# Patient Record
Sex: Female | Born: 1995 | Race: Black or African American | Hispanic: No | Marital: Single | Smoking: Never smoker
Health system: Southern US, Community
[De-identification: ages and names within clinical notes are randomized; demographics above are authoritative.]

## PROBLEM LIST (undated history)

## (undated) DIAGNOSIS — N750 Cyst of Bartholin's gland: Secondary | ICD-10-CM

## (undated) HISTORY — PX: BREAST BIOPSY: SHX20

---

## 2013-05-23 ENCOUNTER — Other Ambulatory Visit (HOSPITAL_COMMUNITY)
Admission: RE | Admit: 2013-05-23 | Discharge: 2013-05-23 | Disposition: A | Discharge status: Home / Self Care | Payer: BC Managed Care – PPO | Source: Ambulatory Visit | Attending: Emergency Medicine | Admitting: Emergency Medicine

## 2013-05-23 ENCOUNTER — Encounter (HOSPITAL_COMMUNITY): Payer: Self-pay | Admitting: Emergency Medicine

## 2013-05-23 ENCOUNTER — Emergency Department (INDEPENDENT_AMBULATORY_CARE_PROVIDER_SITE_OTHER)
Admission: EM | Admit: 2013-05-23 | Discharge: 2013-05-23 | Disposition: A | Discharge status: Home / Self Care | Payer: BC Managed Care – PPO | Source: Home / Self Care

## 2013-05-23 DIAGNOSIS — N760 Acute vaginitis: Secondary | ICD-10-CM

## 2013-05-23 DIAGNOSIS — N730 Acute parametritis and pelvic cellulitis: Secondary | ICD-10-CM

## 2013-05-23 DIAGNOSIS — N72 Inflammatory disease of cervix uteri: Secondary | ICD-10-CM

## 2013-05-23 DIAGNOSIS — Z113 Encounter for screening for infections with a predominantly sexual mode of transmission: Secondary | ICD-10-CM | POA: Insufficient documentation

## 2013-05-23 DIAGNOSIS — N898 Other specified noninflammatory disorders of vagina: Secondary | ICD-10-CM

## 2013-05-23 LAB — POCT URINALYSIS DIP (DEVICE)
BILIRUBIN URINE: NEGATIVE
Glucose, UA: NEGATIVE mg/dL
KETONES UR: NEGATIVE mg/dL
Nitrite: NEGATIVE
Protein, ur: NEGATIVE mg/dL
Specific Gravity, Urine: 1.025 (ref 1.005–1.030)
Urobilinogen, UA: 1 mg/dL (ref 0.0–1.0)
pH: 6 (ref 5.0–8.0)

## 2013-05-23 LAB — POCT PREGNANCY, URINE: Preg Test, Ur: NEGATIVE

## 2013-05-23 MED ORDER — AZITHROMYCIN 250 MG PO TABS: ORAL_TABLET | ORAL | Status: DC

## 2013-05-23 MED ORDER — LIDOCAINE HCL (PF) 1 % IJ SOLN
INTRAMUSCULAR | Status: AC
  Filled 2013-05-23: qty 5

## 2013-05-23 MED ORDER — CEFTRIAXONE SODIUM 250 MG IJ SOLR
INTRAMUSCULAR | Status: AC
  Filled 2013-05-23: qty 250

## 2013-05-23 MED ORDER — METRONIDAZOLE 500 MG PO TABS: 500.0000 mg | ORAL_TABLET | Freq: Two times a day (BID) | ORAL | Status: DC

## 2013-05-23 MED ORDER — CEFTRIAXONE SODIUM 250 MG IJ SOLR
250.0000 mg | Freq: Once | INTRAMUSCULAR | Status: AC
  Administered 2013-05-23: 250 mg via INTRAMUSCULAR

## 2013-05-23 NOTE — ED Notes (Signed)
Pt received injection will discharge at 3:30.  Mw,cma

## 2013-05-23 NOTE — Discharge Instructions (Signed)
Bacterial Vaginosis Bacterial vaginosis is a vaginal infection that occurs when the normal balance of bacteria in the vagina is disrupted. It results from an overgrowth of certain bacteria. This is the most common vaginal infection in women of childbearing age. Treatment is important to prevent complications, especially in pregnant women, as it can cause a premature delivery. CAUSES  Bacterial vaginosis is caused by an increase in harmful bacteria that are normally present in smaller amounts in the vagina. Several different kinds of bacteria can cause bacterial vaginosis. However, the reason that the condition develops is not fully understood. RISK FACTORS Certain activities or behaviors can put you at an increased risk of developing bacterial vaginosis, including:  Having a new sex partner or multiple sex partners.  Douching.  Using an intrauterine device (IUD) for contraception. Women do not get bacterial vaginosis from toilet seats, bedding, swimming pools, or contact with objects around them. SIGNS AND SYMPTOMS  Some women with bacterial vaginosis have no signs or symptoms. Common symptoms include:  Grey vaginal discharge.  A fishlike odor with discharge, especially after sexual intercourse.  Itching or burning of the vagina and vulva.  Burning or pain with urination. DIAGNOSIS  Your health care provider will take a medical history and examine the vagina for signs of bacterial vaginosis. A sample of vaginal fluid may be taken. Your health care provider will look at this sample under a microscope to check for bacteria and abnormal cells. A vaginal pH test may also be done.  TREATMENT  Bacterial vaginosis may be treated with antibiotic medicines. These may be given in the form of a pill or a vaginal cream. A second round of antibiotics may be prescribed if the condition comes back after treatment.  HOME CARE INSTRUCTIONS   Only take over-the-counter or prescription medicines as  directed by your health care provider.  If antibiotic medicine was prescribed, take it as directed. Make sure you finish it even if you start to feel better.  Do not have sex until treatment is completed.  Tell all sexual partners that you have a vaginal infection. They should see their health care provider and be treated if they have problems, such as a mild rash or itching.  Practice safe sex by using condoms and only having one sex partner. SEEK MEDICAL CARE IF:   Your symptoms are not improving after 3 days of treatment.  You have increased discharge or pain.  You have a fever. MAKE SURE YOU:   Understand these instructions.  Will watch your condition.  Will get help right away if you are not doing well or get worse. FOR MORE INFORMATION  Centers for Disease Control and Prevention, Division of STD Prevention: SolutionApps.co.za American Sexual Health Association (ASHA): www.ashastd.org  Document Released: 03/16/2005 Document Revised: 01/04/2013 Document Reviewed: 10/26/2012 [facility] Patient Information 2014 [place], [place].  Cervicitis Cervicitis is a soreness and puffiness (inflammation) of the cervix.  HOME CARE  Do not have sex (intercourse) until your doctor says it is okay.  Do not have sex until your partner is treated or as told by your doctor.  Take your antibiotic medicine as told. Finish it even if you start to feel better. GET HELP IF:   Yours symptoms that brought you to the doctor come back.  You have a fever. MAKE SURE YOU:   Understand these instructions.  Will watch your condition.  Will get help right away if you are not doing well or get worse. Document Released: 12/24/2007 Document Revised: 11/16/2012  Document Reviewed: 09/07/2012 [facility] Patient Information 2014 [place], [place].  Pelvic Inflammatory Disease Pelvic inflammatory disease (PID) refers to an infection in some or all of the female organs. The infection can be in the uterus,  ovaries, fallopian tubes, or the surrounding tissues in the pelvis. PID can cause abdominal or pelvic pain that comes on suddenly (acute pelvic pain). PID is a serious infection because it can lead to lasting (chronic) pelvic pain or the inability to have children (infertile).  CAUSES  The infection is often caused by the normal bacteria found in the vaginal tissues. PID may also be caused by an infection that is spread during sexual contact. PID can also occur following:   The birth of a baby.   A miscarriage.   An abortion.   Major pelvic surgery.   The use of an intrauterine device (IUD).   A sexual assault.  RISK FACTORS Certain factors can put a person at higher risk for PID, such as:  Being younger than 25 years.  Being sexually active at [place] age.  Usingnonbarrier contraception.  Havingmultiple sexual partners.  Having sex with someone who has symptoms of a genital infection.  Using oral contraception. Other times, certain behaviors can increase the possibility of getting PID, such as:  Having sex during your period.  Using a vaginal douche.  Having an intrauterine device (IUD) in place. SYMPTOMS   Abdominal or pelvic pain.   Fever.   Chills.   Abnormal vaginal discharge.  Abnormal uterine bleeding.   Unusual pain shortly after finishing your period. DIAGNOSIS  Your caregiver will choose some of the following methods to make a diagnosis, such as:   Performinga physical exam and history. A pelvic exam typically reveals a very tender uterus and surrounding pelvis.   Ordering laboratory tests including a pregnancy test, blood tests, and urine test.  Orderingcultures of the vagina and cervix to check for a sexually transmitted infection (STI).  Performing an ultrasound.   Performing a laparoscopic procedure to look inside the pelvis.  TREATMENT   Antibiotic medicines may be prescribed and taken by mouth.   Sexual partners may  be treated when the infection is caused by a sexually transmitted disease (STD).   Hospitalization may be needed to give antibiotics intravenously.  Surgery may be needed, but this is rare. It may take weeks until you are completely well. If you are diagnosed with PID, you should also be checked for human immunodeficiency virus (HIV). HOME CARE INSTRUCTIONS   If given, take your antibiotics as directed. Finish the medicine even if you start to feel better.   Only take over-the-counter or prescription medicines for pain, discomfort, or fever as directed by your caregiver.   Do not have sexual intercourse until treatment is completed or as directed by your caregiver. If PID is confirmed, your recent sexual partner(s) will need treatment.   Keep your follow-up appointments. SEEK MEDICAL CARE IF:   You have increased or abnormal vaginal discharge.   You need prescription medicine for your pain.   You vomit.   You cannot take your medicines.   Your partner has an STD.  SEEK IMMEDIATE MEDICAL CARE IF:   You have a fever.   You have increased abdominal or pelvic pain.   You have chills.   You have pain when you urinate.   You are not better after 72 hours following treatment.  MAKE SURE YOU:   Understand these instructions.  Will watch your condition.  Will get help  right away if you are not doing well or get worse. Document Released: 03/16/2005 Document Revised: 07/11/2012 Document Reviewed: 03/12/2011 [facility] Patient Information 2014 [place].  Sexually Transmitted Disease A sexually transmitted disease (STD) is a disease or infection that may be passed (transmitted) from person to person, usually during sexual activity. This may happen by way of saliva, semen, blood, vaginal mucus, or urine. Common STDs include:   Gonorrhea.   Chlamydia.   Syphilis.   HIV and AIDS.   Genital herpes.   Hepatitis B and C.   Trichomonas.   Human  papillomavirus (HPV).   Pubic lice.   Scabies.  Mites.  Bacterial vaginosis. WHAT ARE CAUSES OF STDs? An STD may be caused by bacteria, a virus, or parasites. STDs are often transmitted during sexual activity if one person is infected. However, they may also be transmitted through nonsexual means. STDs may be transmitted after:   Sexual intercourse with an infected person.   Sharing sex toys with an infected person.   Sharing needles with an infected person or using unclean piercing or tattoo needles.  Having intimate contact with the genitals, mouth, or rectal areas of an infected person.   Exposure to infected fluids during birth. WHAT ARE THE SIGNS AND SYMPTOMS OF STDs? Different STDs have different symptoms. Some people may not have any symptoms. If symptoms are present, they may include:   Painful or bloody urination.   Pain in the pelvis, abdomen, vagina, anus, throat, or eyes.   Skin rash, itching, irritation, growths, sores (lesions), ulcerations, or warts in the genital or anal area.  Abnormal vaginal discharge with or without bad odor.   Penile discharge in men.   Fever.   Pain or bleeding during sexual intercourse.   Swollen glands in the groin area.   Yellow skin and eyes (jaundice). This is seen with hepatitis.   Swollen testicles.  Infertility.  Sores and blisters in the mouth. HOW ARE STDs DIAGNOSED? To make a diagnosis, your health care provider may:   Take a medical history.   Perform a physical exam.   Take a sample of any discharge for examination.  Swab the throat, cervix, opening to the penis, rectum, or vagina for testing.  Test a sample of your first morning urine.   Perform blood tests.   Perform a Pap smear, if this applies.   Perform a colposcopy.   Perform a laparoscopy.  HOW ARE STDs TREATED? Treatment depends on the STD. Some STDs may be treated but not cured.   Chlamydia, gonorrhea, trichomonas,  and syphilis can be cured with antibiotics.   Genital herpes, hepatitis, and HIV can be treated, but not cured, with prescribed medicines. The medicines lessen symptoms.   Genital warts from HPV can be treated with medicine or by freezing, burning (electrocautery), or surgery. Warts may come back.   HPV cannot be cured with medicine or surgery. However, abnormal areas may be removed from the cervix, vagina, or vulva.   If your diagnosis is confirmed, your recent sexual partners need treatment. This is true even if they are symptom-free or have a negative culture or evaluation. They should not have sex until their health care providers say it is OK. HOW CAN I REDUCE MY RISK OF GETTING AN STD?  Use latex condoms, dental dams, and water-soluble lubricants during sexual activity. Do not use petroleum jelly or oils.  Get vaccinated for HPV and hepatitis. If you have not received these vaccines in the past, talk to  your health care provider about whether one or both might be right for you.   Avoid risky sex practices that can break the skin.  WHAT SHOULD I DO IF I THINK I HAVE AN STD?  See your health care provider.   Inform all sexual partners. They should be tested and treated for any STDs.  Do not have sex until your health care provider says it is OK. WHEN SHOULD I GET HELP? Seek immediate medical care if:  You develop severe abdominal pain.  You are a man and notice swelling or pain in the testicles.  You are a woman and notice swelling or pain in your vagina. Document Released: 06/06/2002 Document Revised: 01/04/2013 Document Reviewed: 10/04/2012 [facility] Patient Information 2014 [place], [place].  Vaginitis Vaginitis is an inflammation of the vagina. It can happen when the normal bacteria and yeast in the vagina grow too much. There are different types. Treatment will depend on the type you have. HOME CARE  Take all medicines as told by your doctor.  Keep your vagina  area clean and dry. Avoid soap. Rinse the area with water.  Avoid washing and cleaning out the vagina (douching).  Do not use tampons or have sex (intercourse) until your treatment is done.  Wipe from front to back after going to the restroom.  Wear cotton underwear.  Avoid wearing underwear while you sleep until your vaginitis is gone.  Avoid tight pants. Avoid underwear or nylons without a cotton panel.  Take off wet clothing (such as a bathing suit) as soon as you can.  Use mild, unscented products. Avoid fabric softeners and scented:  Feminine sprays.  Laundry detergents.  Tampons.  Soaps or bubble baths.  Practice safe sex and use condoms. GET HELP RIGHT AWAY IF:   You have belly (abdominal) pain.  You have a fever or lasting symptoms for more than 2 3 days.  You have a fever and your symptoms suddenly get worse. MAKE SURE YOU:   Understand these instructions.  Will watch this condition.  Will get help right away if you are not doing well or get worse. Document Released: 06/12/2008 Document Revised: 12/09/2011 Document Reviewed: 08/27/2011 [facility] Patient Information 2014 [place], [place].

## 2013-05-23 NOTE — ED Notes (Signed)
C/o vaginal pain with discharge. Light pink discharge.   Symptoms present x 3 days.  Lower abdominal and pelvic pain.  Denies fever, n/v.    Denies concerns for std's

## 2013-05-23 NOTE — ED Provider Notes (Addendum)
CSN: [id number]     Arrival date & time 05/23/13  1343 History   First MD Initiated Contact with Patient 05/23/13 1444     Chief Complaint  Patient presents with  . Vaginal Pain     (Consider location/radiation/quality/duration/timing/severity/associated sxs/prior Treatment) HPI Comments: 18 year old female complaining of vaginal pain, swelling and a pink discharge 2-3 days. Denies fever or GI symptoms. Currently denies abdominal or pelvic pain.  Patient is a 18 y.o. female presenting with vaginal pain.  Vaginal Pain    History reviewed. No pertinent past medical history. History reviewed. No pertinent past surgical history. History reviewed. No pertinent family history. History  Substance Use Topics  . Smoking status: Never Smoker   . Smokeless tobacco: Not on file  . Alcohol Use: No   OB History   Grav Para Term Preterm Abortions TAB SAB Ect Mult Living                 Review of Systems  Constitutional: Negative.   Respiratory: Negative.   Gastrointestinal: Negative.   Genitourinary: Positive for vaginal pain. Negative for dysuria, frequency and menstrual problem.       As per history of present illness  Neurological: Negative.       Allergies  Review of patient's allergies indicates no known allergies.  Home Medications   Current Outpatient Rx  Name  Route  Sig  Dispense  Refill  . azithromycin (ZITHROMAX) 250 MG tablet      Take all 4 tabs po stat   4 tablet   0   . metroNIDAZOLE (FLAGYL) 500 MG tablet   Oral   Take 1 tablet (500 mg total) by mouth 2 (two) times daily. X 7 days   14 tablet   0    BP 111/75  Pulse 66  Temp(Src) 98.2 F (36.8 C) (Oral)  Resp 16  SpO2 100%  LMP 04/30/2013 Physical Exam  Nursing note and vitals reviewed. Constitutional: She is oriented to person, place, and time. She appears well-developed and well-nourished. No distress.  Neck: Normal range of motion. Neck supple.  Cardiovascular: Normal rate.    Pulmonary/Chest: Effort normal. No respiratory distress.  Abdominal: Soft. Bowel sounds are normal. She exhibits no distension and no mass. There is no tenderness. There is no rebound and no guarding.  Genitourinary:  Normal external female genitalia. Trace amount of dried white discharge on the vulva. Cervix is left of midline posterior there are several erythematous papular vesicular type lesions on the ectocervix and external cervix. Os is nulli parous. There is a thick white discharge in the proximal Armeniahina in vaginal vault as well as coating the cervix. No bleeding is seen.  Musculoskeletal: She exhibits no edema and no tenderness.  Neurological: She is alert and oriented to person, place, and time. She exhibits normal muscle tone.  Skin: Skin is warm and dry.  Psychiatric: She has a normal mood and affect.    ED Course  Procedures (including critical care time) Labs Review Labs Reviewed  POCT URINALYSIS DIP (DEVICE) - Abnormal; Notable for the following:    Hgb urine dipstick MODERATE (*)    Leukocytes, UA LARGE (*)    All other components within normal limits  POCT PREGNANCY, URINE  CERVICOVAGINAL ANCILLARY ONLY   Imaging Review No results found.    MDM   Final diagnoses:  PID (acute pelvic inflammatory disease)  Cervicitis  Vaginal discharge  Acute vulvovaginitis   Rocephin 250 mg IM now Rx for azithromycin 1 g  by mouth now and Rx for Flagyl 500 twice a day for 7 days Ancillary cytology results pending. No urinary symptoms, suspect urine is contaminated.     Hayden Rasmussen, NP 05/23/13 1509  Diflucan 150 mg x 2 doses Rx'd post cytology. 05/25/13  Hayden Rasmussen, NP 05/25/13 2035

## 2013-05-24 LAB — CERVICOVAGINAL ANCILLARY ONLY
Chlamydia: NEGATIVE
NEISSERIA GONORRHEA: NEGATIVE
WET PREP (BD AFFIRM): POSITIVE — AB
Wet Prep (BD Affirm): NEGATIVE
Wet Prep (BD Affirm): POSITIVE — AB

## 2013-05-24 NOTE — ED Provider Notes (Signed)
Medical screening examination/treatment/procedure(s) were performed by a resident physician or non-physician practitioner and as the supervising physician I was immediately available for consultation/collaboration.  Alliah Boulanger, MD    Emerald Shor S Jaide Hillenburg, MD 05/24/13 0742 

## 2013-05-25 ENCOUNTER — Telehealth (HOSPITAL_COMMUNITY): Payer: Self-pay | Admitting: *Deleted

## 2013-05-25 MED ORDER — FLUCONAZOLE 150 MG PO TABS: ORAL_TABLET | ORAL | Status: DC

## 2013-05-25 NOTE — ED Notes (Addendum)
GC/Chlamydia neg., Affirm: Candida and Gardnerella pos., Trich neg. Pt. adequately treated with Flagyl for bacterial vaginosis.   2/25 Message sent to Hayden Rasmussenavid Mabe NP. Vassie MoselleYork, Nikkita Adeyemi M 05/25/2013

## 2013-05-25 NOTE — ED Notes (Addendum)
Julie Rasmussenavid Mabe NP gave verbal order for Diflucan 150 mg. # 2- take one now and repeat if needed.  I called pt., but her VM is not set up. I called her contact (grandmother) and left a message for her to call.  Call 1. Julie Cobb, Julie Cobb 05/25/2013 Pt. called back.  Pt. verified x 2 and given results.  Pt. told she was adequately treated for bacterial vaginosis with Flagyl and needs Diflucan for Candida. Pt. wants Rx. [facility] [address] Rx. called to pharmacist @ [phone]. 05/25/2013

## 2013-05-26 NOTE — ED Provider Notes (Signed)
Medical screening examination/treatment/procedure(s) were performed by a resident physician or non-physician practitioner and as the supervising physician I was immediately available for consultation/collaboration.  Evan Corey, MD    Evan S Corey, MD 05/26/13 0733 

## 2013-10-10 ENCOUNTER — Encounter (HOSPITAL_COMMUNITY): Payer: Self-pay | Admitting: Emergency Medicine

## 2013-10-10 ENCOUNTER — Emergency Department (INDEPENDENT_AMBULATORY_CARE_PROVIDER_SITE_OTHER)
Admission: EM | Admit: 2013-10-10 | Discharge: 2013-10-10 | Disposition: A | Discharge status: Home / Self Care | Payer: BC Managed Care – PPO | Source: Home / Self Care

## 2013-10-10 ENCOUNTER — Other Ambulatory Visit (HOSPITAL_COMMUNITY)
Admission: RE | Admit: 2013-10-10 | Discharge: 2013-10-10 | Disposition: A | Discharge status: Home / Self Care | Payer: BC Managed Care – PPO | Source: Ambulatory Visit | Attending: Family Medicine | Admitting: Family Medicine

## 2013-10-10 DIAGNOSIS — N760 Acute vaginitis: Secondary | ICD-10-CM | POA: Insufficient documentation

## 2013-10-10 DIAGNOSIS — Z113 Encounter for screening for infections with a predominantly sexual mode of transmission: Secondary | ICD-10-CM | POA: Insufficient documentation

## 2013-10-10 DIAGNOSIS — N898 Other specified noninflammatory disorders of vagina: Secondary | ICD-10-CM

## 2013-10-10 LAB — POCT URINALYSIS DIP (DEVICE)
Bilirubin Urine: NEGATIVE
Glucose, UA: NEGATIVE mg/dL
Hgb urine dipstick: NEGATIVE
Ketones, ur: NEGATIVE mg/dL
Leukocytes, UA: NEGATIVE
NITRITE: NEGATIVE
Protein, ur: NEGATIVE mg/dL
SPECIFIC GRAVITY, URINE: 1.02 (ref 1.005–1.030)
UROBILINOGEN UA: 1 mg/dL (ref 0.0–1.0)
pH: 7 (ref 5.0–8.0)

## 2013-10-10 LAB — POCT PREGNANCY, URINE: Preg Test, Ur: NEGATIVE

## 2013-10-10 NOTE — ED Provider Notes (Signed)
CSN: [id number]     Arrival date & time 10/10/13  1706 History   None    Chief Complaint  Patient presents with  . Vaginal Discharge   (Consider location/radiation/quality/duration/timing/severity/associated sxs/prior Treatment) HPI  Vaginal discharge. Started 7 days ago. No change. Denies fevers, odor, irritation, abd pain, dysuria, frequency. Has had this occur before. Sexually active and does not protect. No new sexual partners. Cranberry juice w/o benefit. H/o PID several months ago.   History reviewed. No pertinent past medical history. History reviewed. No pertinent past surgical history. No family history on file. History  Substance Use Topics  . Smoking status: Never Smoker   . Smokeless tobacco: Not on file  . Alcohol Use: No   OB History   Grav Para Term Preterm Abortions TAB SAB Ect Mult Living                 Review of Systems  Per hpi w/ all other systems negative.  Allergies  Review of patient's allergies indicates no known allergies.  Home Medications   Prior to Admission medications   Medication Sig Start Date End Date Taking? Authorizing Provider  azithromycin (ZITHROMAX) 250 MG tablet Take all 4 tabs po stat 05/23/13   Hayden Rasmussenavid Mabe, NP  fluconazole (DIFLUCAN) 150 MG tablet 1 tab po x 1. May repeat in 72 hours if no improvement 05/25/13   Hayden Rasmussenavid Mabe, NP  metroNIDAZOLE (FLAGYL) 500 MG tablet Take 1 tablet (500 mg total) by mouth 2 (two) times daily. X 7 days 05/23/13   Hayden Rasmussenavid Mabe, NP   BP 132/76  Pulse 80  Temp(Src) 98.5 F (36.9 C) (Oral)  Resp 16  SpO2 100% Physical Exam  Constitutional: She appears well-developed and well-nourished. No distress.  HENT:  Head: Normocephalic and atraumatic.  Eyes: EOM are normal. Pupils are equal, round, and reactive to light.  Neck: Normal range of motion.  Pulmonary/Chest: Effort normal. No respiratory distress.  Abdominal: Soft. She exhibits no distension and no mass. There is no tenderness. There is no rebound and  no guarding.  Genitourinary: Uterus normal. Vaginal discharge found.  No cervical motion tednerness  Musculoskeletal: Normal range of motion. She exhibits no tenderness.  Neurological: She exhibits normal muscle tone.  Skin: Skin is warm and dry.  Psychiatric: She has a normal mood and affect. Her behavior is normal. Judgment and thought content normal.    ED Course  Procedures (including critical care time) Labs Review Labs Reviewed  POCT URINALYSIS DIP (DEVICE)  POCT PREGNANCY, URINE  CERVICOVAGINAL ANCILLARY ONLY    Imaging Review No results found.   MDM   1. Vaginal discharge   Most likely nml physiologic discharge vs yeast vaginitis or BV. Wet prep, GC, CHl sent. Pt refusing HIV and RPR testing / baseline. Will call w/ results if + and treat as necessary.   Precautions given and all questions answered   Shelly Flattenavid Merrell, MD Family Medicine 10/10/13  6:21 PM     Thora Lanceavid J Merrill, MD 10/10/13 [id number]

## 2013-10-10 NOTE — Discharge Instructions (Signed)
The cause of your vaginal discharge may be normal changes in your bodily function or an early infection We will call you if any of your lab work shows an infection

## 2013-10-10 NOTE — ED Notes (Signed)
Pt reports vag d/c and tenderness of bilateral breast  x1 week  Denies urinary sx, abd pain, f/v/n/d Alert w/no signs of acute distress.

## 2013-10-12 ENCOUNTER — Telehealth (HOSPITAL_COMMUNITY): Payer: Self-pay | Admitting: Family Medicine

## 2013-10-12 ENCOUNTER — Telehealth (HOSPITAL_COMMUNITY): Payer: Self-pay | Admitting: *Deleted

## 2013-10-12 MED ORDER — METRONIDAZOLE 500 MG PO TABS: 500.0000 mg | ORAL_TABLET | Freq: Two times a day (BID) | ORAL | Status: DC

## 2013-10-12 MED ORDER — FLUCONAZOLE 150 MG PO TABS
150.0000 mg | ORAL_TABLET | Freq: Every day | ORAL | Status: DC
Start: 2013-10-12 — End: 2014-07-04

## 2013-10-12 MED ORDER — FLUCONAZOLE 150 MG PO TABS: ORAL_TABLET | ORAL | Status: DC

## 2013-10-12 NOTE — Telephone Encounter (Signed)
Message copied by Ozella RocksMERRELL, DAVID J on Thu Oct 12, 2013  2:43 PM ------      Message from: Vassie MoselleYORK, SUZANNE M      Created: Thu Oct 12, 2013  2:21 PM      Regarding: RE: labs       I looked again.  This is your pt. from 7/14.  The Rx.'s by Onalee Huaavid were done in Feb. of this year.      10/12/2013            ----- Message -----         From: Ozella Rocksavid J Merrell, MD         Sent: 10/11/2013  10:51 PM           To: Vassie MoselleSuzanne M York, RN      Subject: RE: labs                                                 Pretty sure I saw that Mabe sent in metro and fluconazole      ----- Message -----         From: Vassie MoselleSuzanne M York, RN         Sent: 10/11/2013  10:47 PM           To: Ozella Rocksavid J Merrell, MD      Subject: labs                                                     Candida and Gardnerella pos. Rest of labs neg.  Did you want to treat this? I can notify the pt. if you do.      Vassie MoselleYork, Suzanne M      10/11/2013                   ------

## 2013-10-12 NOTE — ED Notes (Signed)
GC/Chlamydia neg., Affirm: Candida and Gardnerella pos., Trich neg.  7/14 Message sent to Dr. Konrad DoloresMerrell.  7/15 Discussed with Dr. Konrad DoloresMerrell.  He did Rx.'s of Diflucan and Flagyl.  I called pt. Pt. verified x 2 and given results.  Pt. Told she needs Diflucan for the yeast infection and Flagyl for bacterial vaginosis.   Pt. instructed to no alcohol while taking the Flagyl.  [facility]. [facility] [address]  I called the pharmacist but the Rx.'s had not crossed over. I told her I would call back.  I asked Dr. Konrad DoloresMerrell again.   He said it was because he had not signed them. I asked Dr. Konrad DoloresMerrell to e-prescribe them to pt.'s pharmacy.  I called the pharmacy back and they have received them. Vassie MoselleYork, Julie Cobb 10/12/2013

## 2014-01-26 ENCOUNTER — Emergency Department (HOSPITAL_COMMUNITY)
Admission: EM | Admit: 2014-01-26 | Discharge: 2014-01-26 | Disposition: A | Discharge status: Home / Self Care | Payer: BC Managed Care – PPO | Attending: Emergency Medicine | Admitting: Emergency Medicine

## 2014-01-26 ENCOUNTER — Encounter (HOSPITAL_COMMUNITY): Payer: Self-pay | Admitting: Emergency Medicine

## 2014-01-26 DIAGNOSIS — J069 Acute upper respiratory infection, unspecified: Secondary | ICD-10-CM

## 2014-01-26 DIAGNOSIS — Z792 Long term (current) use of antibiotics: Secondary | ICD-10-CM | POA: Insufficient documentation

## 2014-01-26 DIAGNOSIS — Z79899 Other long term (current) drug therapy: Secondary | ICD-10-CM | POA: Diagnosis not present

## 2014-01-26 DIAGNOSIS — R197 Diarrhea, unspecified: Secondary | ICD-10-CM | POA: Diagnosis not present

## 2014-01-26 DIAGNOSIS — R05 Cough: Secondary | ICD-10-CM | POA: Diagnosis present

## 2014-01-26 LAB — RAPID STREP SCREEN (MED CTR MEBANE ONLY): Streptococcus, Group A Screen (Direct): NEGATIVE

## 2014-01-26 NOTE — ED Notes (Signed)
C/o nasal congestion, cough with green sputum and sore throat x 5 days.

## 2014-01-26 NOTE — ED Notes (Signed)
Pt having URI symptoms since monday

## 2014-01-26 NOTE — Discharge Instructions (Signed)
Upper Respiratory Infection, Adult An upper respiratory infection (URI) is also known as the common cold. It is often caused by a type of germ (virus). Colds are easily spread (contagious). You can pass it to others by kissing, coughing, sneezing, or drinking out of the same glass. Usually, you get better in 1 or 2 weeks.  HOME CARE   Only take medicine as told by your doctor.  Use a warm mist humidifier or breathe in steam from a hot shower.  Drink enough water and fluids to keep your pee (urine) clear or pale yellow.  Get plenty of rest.  Return to work when your temperature is back to normal or as told by your doctor. You may use a face mask and wash your hands to stop your cold from spreading. GET HELP RIGHT AWAY IF:   After the first few days, you feel you are getting worse.  You have questions about your medicine.  You have chills, shortness of breath, or brown or red spit (mucus).  You have yellow or brown snot (nasal discharge) or pain in the face, especially when you bend forward.  You have a fever, puffy (swollen) neck, pain when you swallow, or white spots in the back of your throat.  You have a bad headache, ear pain, sinus pain, or chest pain.  You have a high-pitched whistling sound when you breathe in and out (wheezing).  You have a lasting cough or cough up blood.  You have sore muscles or a stiff neck. MAKE SURE YOU:   Understand these instructions.  Will watch your condition.  Will get help right away if you are not doing well or get worse. Document Released: 09/02/2007 Document Revised: 06/08/2011 Document Reviewed: 06/21/2013 Exit[employer] Patient Information 2015 [employer]. This information is not intended to replace advice given to you by your health care provider. Make sure you discuss any questions you have with your health care provider.  

## 2014-01-26 NOTE — ED Provider Notes (Signed)
Medical screening examination/treatment/procedure(s) were performed by non-physician practitioner and as supervising physician I was immediately available for consultation/collaboration.  Semir Brill L Dutch Ing, MD 01/26/14 1541 

## 2014-01-26 NOTE — ED Provider Notes (Signed)
CSN: [id number]     Arrival date & time 01/26/14  1254 History  This chart was scribed for non-physician practitioner, Ivar Drapeob Blyss Lugar, PA-C,working with Flint MelterElliott L Wentz, MD, by Karle PlumberJennifer Tensley, ED Scribe. This patient was seen in room TR06C/TR06C and the patient's care was started at 1:09 PM.  Chief Complaint  Patient presents with  . URI   Patient is a 18 y.o. female presenting with URI. The history is provided by the patient. No language interpreter was used.  URI Presenting symptoms: congestion, cough and sore throat   Presenting symptoms: no fever    HPI Comments:  Julie Cobb is a 18 y.o. female who presents to the Emergency Department complaining of nasal congestion, productive cough and sore throat that began four days ago. She reports associated chest wall soreness secondary to cough. Reports subjective fever and diarrhea. Denies any abdominal pain. She has taken Robitussin and TheraFlu with no significant relief of the symptoms. Reports sick contacts stating her boyfriend has had similar symptoms. She denies nausea, vomiting, constipation and hematochezia.  History reviewed. No pertinent past medical history. History reviewed. No pertinent past surgical history. History reviewed. No pertinent family history. History  Substance Use Topics  . Smoking status: Never Smoker   . Smokeless tobacco: Not on file  . Alcohol Use: No   OB History   Grav Para Term Preterm Abortions TAB SAB Ect Mult Living                 Review of Systems  Constitutional: Negative for fever and chills.  HENT: Positive for congestion and sore throat.   Respiratory: Positive for cough.   Gastrointestinal: Positive for diarrhea. Negative for nausea, vomiting and constipation.    Allergies  Review of patient's allergies indicates no known allergies.  Home Medications   Prior to Admission medications   Medication Sig Start Date End Date Taking? Authorizing Provider  azithromycin (ZITHROMAX) 250  MG tablet Take all 4 tabs po stat 05/23/13   Hayden Rasmussenavid Mabe, NP  fluconazole (DIFLUCAN) 150 MG tablet 1 tab po x 1. May repeat in 72 hours if no improvement 10/12/13   Ozella Rocksavid J Merrell, MD  fluconazole (DIFLUCAN) 150 MG tablet Take 1 tablet (150 mg total) by mouth daily. Repeat dose in 3 days 10/12/13   Ozella Rocksavid J Merrell, MD  metroNIDAZOLE (FLAGYL) 500 MG tablet Take 1 tablet (500 mg total) by mouth 2 (two) times daily. X 7 days 10/12/13   Ozella Rocksavid J Merrell, MD  metroNIDAZOLE (FLAGYL) 500 MG tablet Take 1 tablet (500 mg total) by mouth 2 (two) times daily. 10/12/13   Ozella Rocksavid J Merrell, MD   Triage Vitals: BP 129/73  Pulse 67  Temp(Src) 97.8 F (36.6 C)  Resp 18  Ht 5\' 2"  (1.575 m)  Wt 140 lb (63.504 kg)  BMI 25.60 kg/m2  SpO2 98% Physical Exam  Nursing note and vitals reviewed. Constitutional: She is oriented to person, place, and time. She appears well-developed and well-nourished.  HENT:  Head: Normocephalic and atraumatic.  Mouth/Throat: Uvula is midline and mucous membranes are normal. Posterior oropharyngeal erythema present.  Oropharynx is mildly erythematous. No tonsillar exudate. No peritonsillar abscess.  Eyes: EOM are normal.  Neck: Normal range of motion.  Cardiovascular: Normal rate, regular rhythm and normal heart sounds.  Exam reveals no gallop and no friction rub.   No murmur heard. Pulmonary/Chest: Effort normal and breath sounds normal. No respiratory distress. She has no wheezes. She has no rales.  Musculoskeletal: Normal range of  motion.  Neurological: She is alert and oriented to person, place, and time.  Skin: Skin is warm and dry.  Psychiatric: She has a normal mood and affect. Her behavior is normal.    ED Course  Procedures (including critical care time) DIAGNOSTIC STUDIES: Oxygen Saturation is 98% on RA, normal by my interpretation.   COORDINATION OF CARE: 1:13 PM- Will order rapid strep test. Pt verbalizes understanding and agrees to plan.  Medications - No data to  display  Labs Review Labs Reviewed  RAPID STREP SCREEN    Imaging Review No results found.   EKG Interpretation None      MDM   Final diagnoses:  URI (upper respiratory infection)   Patients symptoms are consistent with URI, likely viral etiology. Discussed that antibiotics are not indicated for viral infections. Pt will be discharged with symptomatic treatment.  Verbalizes understanding and is agreeable with plan. Pt is hemodynamically stable & in NAD prior to dc.  Filed Vitals:   01/26/14 1259  BP: 129/73  Pulse: 67  Temp: 97.8 F (36.6 C)  Resp: 18    I personally performed the services described in this documentation, which was scribed in my presence. The recorded information has been reviewed and is accurate.    Roxy Horsemanobert Marcedes Tech, PA-C 01/26/14 1356

## 2014-01-28 LAB — CULTURE, GROUP A STREP

## 2014-06-13 ENCOUNTER — Emergency Department (INDEPENDENT_AMBULATORY_CARE_PROVIDER_SITE_OTHER)
Admission: EM | Admit: 2014-06-13 | Discharge: 2014-06-13 | Disposition: A | Discharge status: Home / Self Care | Payer: BLUE CROSS/BLUE SHIELD | Source: Home / Self Care | Attending: Family Medicine | Admitting: Family Medicine

## 2014-06-13 ENCOUNTER — Emergency Department (INDEPENDENT_AMBULATORY_CARE_PROVIDER_SITE_OTHER): Payer: BLUE CROSS/BLUE SHIELD

## 2014-06-13 ENCOUNTER — Encounter (HOSPITAL_COMMUNITY): Payer: Self-pay

## 2014-06-13 DIAGNOSIS — S86812A Strain of other muscle(s) and tendon(s) at lower leg level, left leg, initial encounter: Secondary | ICD-10-CM

## 2014-06-13 DIAGNOSIS — S86912A Strain of unspecified muscle(s) and tendon(s) at lower leg level, left leg, initial encounter: Secondary | ICD-10-CM

## 2014-06-13 LAB — POCT PREGNANCY, URINE: Preg Test, Ur: NEGATIVE

## 2014-06-13 NOTE — ED Provider Notes (Signed)
CSN: [id number]     Arrival date & time 06/13/14  1522 History   First MD Initiated Contact with Patient 06/13/14 1715     Chief Complaint  Patient presents with  . Knee Pain   (Consider location/radiation/quality/duration/timing/severity/associated sxs/prior Treatment) Patient is a 19 y.o. female presenting with knee pain. The history is provided by the patient.  Knee Pain Location:  Knee Time since incident:  1 day Injury: yes   Mechanism of injury: fall   Mechanism of injury comment:  Walking on sidewalk and knee twisted. Fall:    Fall occurred:  Walking   Impact surface:  Concrete Knee location:  L knee Pain details:    Severity:  Mild   Duration:  2 days Prior injury to area:  No Associated symptoms: no back pain     History reviewed. No pertinent past medical history. History reviewed. No pertinent past surgical history. History reviewed. No pertinent family history. History  Substance Use Topics  . Smoking status: Never Smoker   . Smokeless tobacco: Not on file  . Alcohol Use: No   OB History    No data available     Review of Systems  Constitutional: Negative.   Musculoskeletal: Positive for gait problem. Negative for back pain and joint swelling.  Skin: Negative for wound.    Allergies  Review of patient's allergies indicates no known allergies.  Home Medications   Prior to Admission medications   Medication Sig Start Date End Date Taking? Authorizing Provider  azithromycin (ZITHROMAX) 250 MG tablet Take all 4 tabs po stat 05/23/13   Hayden Rasmussenavid Mabe, NP  fluconazole (DIFLUCAN) 150 MG tablet 1 tab po x 1. May repeat in 72 hours if no improvement 10/12/13   Ozella Rocksavid J Merrell, MD  fluconazole (DIFLUCAN) 150 MG tablet Take 1 tablet (150 mg total) by mouth daily. Repeat dose in 3 days 10/12/13   Ozella Rocksavid J Merrell, MD  metroNIDAZOLE (FLAGYL) 500 MG tablet Take 1 tablet (500 mg total) by mouth 2 (two) times daily. X 7 days 10/12/13   Ozella Rocksavid J Merrell, MD  metroNIDAZOLE  (FLAGYL) 500 MG tablet Take 1 tablet (500 mg total) by mouth 2 (two) times daily. 10/12/13   Ozella Rocksavid J Merrell, MD   BP 117/82 mmHg  Pulse 78  Temp(Src) 99.5 F (37.5 C) (Oral)  Resp 12  SpO2 100%  LMP  Physical Exam  Constitutional: She is oriented to person, place, and time. She appears well-developed and well-nourished. No distress.  Musculoskeletal: She exhibits tenderness.       Left knee: She exhibits normal range of motion, no swelling, no effusion, no LCL laxity, normal patellar mobility and no MCL laxity. Tenderness found. Medial joint line and MCL tenderness noted. No patellar tendon tenderness noted.  Neurological: She is alert and oriented to person, place, and time.  Skin: Skin is warm and dry.  Nursing note and vitals reviewed.   ED Course  Procedures (including critical care time) Labs Review Labs Reviewed  POCT PREGNANCY, URINE    Imaging Review Dg Knee Complete 4 Views Left  06/13/2014   CLINICAL DATA:  19 year old female with a history of fall and knee pain.  EXAM: LEFT KNEE - COMPLETE 4+ VIEW  COMPARISON:  None.  FINDINGS: There is no evidence of fracture, dislocation, or joint effusion. There is no evidence of arthropathy or other focal bone abnormality. Soft tissues are unremarkable.  IMPRESSION: Negative for acute bony abnormality.  Signed,  Yvone NeuJaime S. Loreta AveWagner, DO  [facility]  [facility]   Electronically Signed   By: Gilmer Mor D.O.   On: 06/13/2014 18:05   X-rays reviewed and report per radiologist.   MDM   1. Knee strain, left, initial encounter        Linna Hoff, MD 06/13/14 [phone]

## 2014-06-13 NOTE — ED Notes (Signed)
States she tripped and fell yesterday, and her left knee twisted . C/o pain and limited motion same. Tylenol for pain w minimal relief

## 2014-06-13 NOTE — Discharge Instructions (Signed)
Wear knee support , use ice and advil  as needed, activity as tolerated, do exercises as instructed when in brace 3 times a day, see orthopedist if further problems.

## 2014-06-29 ENCOUNTER — Encounter (HOSPITAL_COMMUNITY): Payer: Self-pay | Admitting: Emergency Medicine

## 2014-06-29 ENCOUNTER — Emergency Department (HOSPITAL_COMMUNITY)
Admission: EM | Admit: 2014-06-29 | Discharge: 2014-06-29 | Disposition: A | Discharge status: Home / Self Care | Payer: BLUE CROSS/BLUE SHIELD | Attending: Emergency Medicine | Admitting: Emergency Medicine

## 2014-06-29 DIAGNOSIS — N750 Cyst of Bartholin's gland: Secondary | ICD-10-CM | POA: Insufficient documentation

## 2014-06-29 DIAGNOSIS — N907 Vulvar cyst: Secondary | ICD-10-CM | POA: Insufficient documentation

## 2014-06-29 DIAGNOSIS — R102 Pelvic and perineal pain: Secondary | ICD-10-CM | POA: Diagnosis present

## 2014-06-29 MED ORDER — HYDROCODONE-ACETAMINOPHEN 5-325 MG PO TABS: 1.0000 | ORAL_TABLET | Freq: Four times a day (QID) | ORAL | Status: AC | PRN

## 2014-06-29 MED ORDER — LIDOCAINE-EPINEPHRINE (PF) 2 %-1:200000 IJ SOLN
10.0000 mL | Freq: Once | INTRAMUSCULAR | Status: DC
  Filled 2014-06-29: qty 20

## 2014-06-29 MED ORDER — TRIPLE ANTIBIOTIC 5-400-5000 EX OINT: TOPICAL_OINTMENT | Freq: Four times a day (QID) | CUTANEOUS | Status: AC

## 2014-06-29 NOTE — ED Notes (Signed)
Pt. reports vaginal pain with right labial swelling onset this evening , denies injury / no drainage .

## 2014-06-29 NOTE — ED Notes (Signed)
Pt ambulated back to room from rest room.

## 2014-06-29 NOTE — ED Provider Notes (Signed)
CSN: [id number]     Arrival date & time 06/29/14  0116 History  This chart was scribed for Derwood Kaplan, MD by Tanda Rockers, ED Scribe. This patient was seen in room A11C/A11C and the patient's care was started at 2:13 AM.    Chief Complaint  Patient presents with  . Vaginal Pain   The history is provided by the patient. No language interpreter was used.     HPI Comments: Ty Oshima is a 19 y.o. female who presents to the Emergency Department complaining of vaginal pain that began this evening about 1 hour ago. Pt states that her right labia is swollen. She believes it could be caused by her underwear. Pt denies using any lubricant during sex. She does admit to shaving in the area but denies shaving in the area where there is swelling. Pt states she is having white vaginal discharge as well but that it is normal for her. She denies history of STDs or risk factors for STDs.   Past Medical History  Diagnosis Date  . Bartholin cyst    Past Surgical History  Procedure Laterality Date  . Breast biopsy     No family history on file. History  Substance Use Topics  . Smoking status: Never Smoker   . Smokeless tobacco: Not on file  . Alcohol Use: No   OB History    Gravida Para Term Preterm AB TAB SAB Ectopic Multiple Living       Review of Systems  Constitutional: Negative for fever and chills.  HENT: Negative for congestion and rhinorrhea.   Respiratory: Negative for cough and shortness of breath.   Cardiovascular: Negative for chest pain and leg swelling.  Gastrointestinal: Negative for nausea, vomiting and diarrhea.  Genitourinary: Positive for vaginal discharge and vaginal pain.  Musculoskeletal: Negative for back pain and neck pain.  Skin:       Swelling to right labia.   Neurological: Negative for dizziness and headaches.      Allergies  Review of patient's allergies indicates no known allergies.  Home Medications   Prior to Admission  medications   Medication Sig Start Date End Date Taking? Authorizing Provider  HYDROcodone-acetaminophen (NORCO/VICODIN) 5-325 MG per tablet Take 1 tablet by mouth every 6 (six) hours as needed. Patient not taking: Reported on 07/04/2014 06/29/14   Derwood Kaplan, MD  ibuprofen (ADVIL,MOTRIN) 200 MG tablet Take 400 mg by mouth every 6 (six) hours as needed for moderate pain.    Historical Provider, MD  neomycin-bacitracin-polymyxin (NEOSPORIN) [phone] ointment Apply topically 4 (four) times daily. Patient taking differently: Apply 1 application topically 4 (four) times daily.  06/29/14   Derwood Kaplan, MD  sulfamethoxazole-trimethoprim (BACTRIM) 400-80 MG per tablet Take 1 tablet by mouth 2 (two) times daily. 07/04/14   Fredirick Lathe, MD   Triage Vitals: BP 109/77 mmHg  Pulse 78  Temp(Src) 98.4 F (36.9 C) (Oral)  Resp 14  Ht  (1.6 m)  Wt 146 lb (66.225 kg)  BMI 25.87 kg/m2  SpO2 98%  LMP    Physical Exam  Constitutional: She is oriented to person, place, and time. She appears well-developed and well-nourished.  Non-toxic appearance. No distress.  HENT:  Head: Normocephalic and atraumatic.  Eyes: Conjunctivae, EOM and lids are normal. Pupils are equal, round, and reactive to light.  Neck: Normal range of motion. Neck supple. No tracheal deviation present. No thyroid mass present.  Cardiovascular: Normal rate, regular rhythm  and normal heart sounds.  Exam reveals no gallop.   No murmur heard. Pulmonary/Chest: Effort normal and breath sounds normal. No stridor. No respiratory distress. She has no decreased breath sounds. She has no wheezes. She has no rhonchi. She has no rales.  Abdominal: Soft. Normal appearance and bowel sounds are normal. She exhibits no distension. There is no tenderness. There is no rebound and no CVA tenderness.  Genitourinary:  Right vulvar region is edematous and fluctuant. The swelling extends towards the mucosal region.  Musculoskeletal: Normal range of  motion. She exhibits no edema or tenderness.  Neurological: She is alert and oriented to person, place, and time. She has normal strength. No cranial nerve deficit or sensory deficit. GCS eye subscore is 4. GCS verbal subscore is 5. GCS motor subscore is 6.  Skin: Skin is warm and dry. No abrasion and no rash noted.  Psychiatric: She has a normal mood and affect. Her speech is normal and behavior is normal.  Nursing note and vitals reviewed.   ED Course  INCISION AND DRAINAGE Date/Time: 06/29/2014 5:50 AM Performed by: Derwood KaplanNANAVATI, [place] Authorized by: Derwood KaplanNANAVATI, Kani Jobson Consent: Verbal consent obtained. Risks and benefits: risks, benefits and alternatives were discussed Consent given by: patient Patient understanding: patient states understanding of the procedure being performed Patient identity confirmed: arm band Time out: Immediately prior to procedure a "time out" was called to verify the correct patient, procedure, equipment, support staff and site/side marked as required. Type: cyst Body area: anogenital Location details: vulva Anesthesia: local infiltration Local anesthetic: lidocaine 1% without epinephrine Anesthetic total: 3 ml Patient sedated: no Risk factor: underlying major vessel Scalpel size: 10 Incision type: single straight Complexity: simple Drainage: bloody Drainage amount: moderate Wound treatment: wound left open Patient tolerance: Patient tolerated the procedure well with no immediate complications Comments: Suspected Bartholin's gland or cyst. However, no true cyst appreciated during the procedure. With the fluctuance, abscess also considered, however, no abscess drained either. PT had blood discharge only. Tried Word catheter insertion, but the catheter wouldn't stay. Decided to irrigate and leave the wound open, and requested Gyne f/u.    (including critical care time)  DIAGNOSTIC STUDIES: Oxygen Saturation is 98% on RA, normal by my interpretation.     COORDINATION OF CARE: 2:20 AM-Discussed treatment plan which includes pelvic exam with pt at bedside and pt agreed to plan.   Labs Review Labs Reviewed - No data to display  Imaging Review No results found.   EKG Interpretation None      MDM   Final diagnoses:  Labial cyst  Bartholin gland cyst    Pt comes in with labial cyst. DDx, Bartholin's gland/cyst, vulvar abscess. Pt preferred that we drain the lesion in the ER, as she doesn't have a Theatre managerGynecologist in [place]. Upon performing the i&d, no abscess seen, no cyst seen. Bloody discharge. Vulva was back to normal size. Irrigated the lesion, unable to place a catheter.  Started pt on bactrim prophylacttically. Advised close gyne f/u in 3-5 days. Strict return precautions for infection like symptoms and increased pain discussed.     Derwood KaplanAnkit Ibrahim Mcpheeters, MD 07/06/14 1600

## 2014-06-29 NOTE — Discharge Instructions (Signed)
We drained the swelling in the labial region - which we think was a bartholin cyst. Please see the womens clinic on Monday. Keep the area clean and dry. You may apply the neosporin ointment on to the area.   Bartholin's Cyst or Abscess Bartholin's glands are small glands located within the folds of skin (labia) along the sides of the lower opening of the vagina (birth canal). A cyst may develop when the duct of the gland becomes blocked. When this happens, fluid that accumulates within the cyst can become infected. This is known as an abscess. The Bartholin gland produces a mucous fluid to lubricate the outside of the vagina during sexual intercourse. SYMPTOMS   Patients with a small cyst may not have any symptoms.  Mild discomfort to severe pain depending on the size of the cyst and if it is infected (abscess).  Pain, redness, and swelling around the lower opening of the vagina.  Painful intercourse.  Pressure in the perineal area.  Swelling of the lips of the vagina (labia).  The cyst or abscess can be on one side or both sides of the vagina. DIAGNOSIS   A large swelling is seen in the lower vagina area by your caregiver.  Painful to touch.  Redness and pain, if it is an abscess. TREATMENT   Sometimes the cyst will go away on its own.  Apply warm wet compresses to the area or take hot sitz baths several times a day.  An incision to drain the cyst or abscess with local anesthesia.  Culture the pus, if it is an abscess.  Antibiotic treatment, if it is an abscess.  Cut open the gland and suture the edges to make the opening of the gland bigger (marsupialization).  Remove the whole gland if the cyst or abscess returns. PREVENTION   Practice good hygiene.  Clean the vaginal area with a mild soap and soft cloth when bathing.  Do not rub hard in the vaginal area when bathing.  Protect the crotch area with a padded cushion if you take long bike rides or ride  horses.  Be sure you are well lubricated when you have sexual intercourse. HOME CARE INSTRUCTIONS   If your cyst or abscess was opened, a small piece of gauze, or a drain, may have been placed in the wound to allow drainage. Do not remove this gauze or drain unless directed by your caregiver.  Wear feminine pads, not tampons, as needed for any drainage or bleeding.  If antibiotics were prescribed, take them exactly as directed. Finish the entire course.  Only take over-the-counter or prescription medicines for pain, discomfort, or fever as directed by your caregiver. SEEK IMMEDIATE MEDICAL CARE IF:   You have an increase in pain, redness, swelling, or drainage.  You have bleeding from the wound which results in the use of more than the number of pads suggested by your caregiver in 24 hours.  You have chills.  You have a fever.  You develop any new problems (symptoms) or aggravation of your existing condition. MAKE SURE YOU:   Understand these instructions.  Will watch your condition.  Will get help right away if you are not doing well or get worse. Document Released: 03/16/2005 Document Revised: 06/08/2011 Document Reviewed: 11/02/2007 [facility] Patient Information 2015 [employer], [place]. This information is not intended to replace advice given to you by your health care provider. Make sure you discuss any questions you have with your health care provider.

## 2014-06-29 NOTE — ED Notes (Signed)
PT ambulated to restroom.

## 2014-07-04 ENCOUNTER — Encounter (HOSPITAL_COMMUNITY): Payer: Self-pay | Admitting: *Deleted

## 2014-07-04 ENCOUNTER — Inpatient Hospital Stay (HOSPITAL_COMMUNITY)
Admission: AD | Admit: 2014-07-04 | Discharge: 2014-07-04 | Disposition: A | Discharge status: Home / Self Care | Payer: BLUE CROSS/BLUE SHIELD | Source: Ambulatory Visit | Attending: Obstetrics and Gynecology | Admitting: Obstetrics and Gynecology

## 2014-07-04 DIAGNOSIS — N907 Vulvar cyst: Secondary | ICD-10-CM | POA: Insufficient documentation

## 2014-07-04 DIAGNOSIS — N9089 Other specified noninflammatory disorders of vulva and perineum: Secondary | ICD-10-CM | POA: Diagnosis present

## 2014-07-04 DIAGNOSIS — N762 Acute vulvitis: Secondary | ICD-10-CM | POA: Diagnosis not present

## 2014-07-04 HISTORY — DX: Cyst of Bartholin's gland: N75.0

## 2014-07-04 MED ORDER — SULFAMETHOXAZOLE-TRIMETHOPRIM 400-80 MG PO TABS: 1.0000 | ORAL_TABLET | Freq: Two times a day (BID) | ORAL | Status: AC

## 2014-07-04 NOTE — Discharge Instructions (Signed)

## 2014-07-04 NOTE — MAU Provider Note (Signed)
History     CSN: [id number]  Arrival date and time: 07/04/14 1551   None     Chief Complaint  Patient presents with  . Vaginal Pain   HPI  Here with vaginal pain, seen recently in ED where she had right vulvar "cyst" lanced.  rx'd percocets which she has not taken, no antibiotics.  No fever, not worsening but still painful  Past Medical History  Diagnosis Date  . Bartholin cyst     Past Surgical History  Procedure Laterality Date  . Breast biopsy      History reviewed. No pertinent family history.  History  Substance Use Topics  . Smoking status: Never Smoker   . Smokeless tobacco: Not on file  . Alcohol Use: No    Allergies: No Known Allergies  Prescriptions prior to admission  Medication Sig Dispense Refill Last Dose  . ibuprofen (ADVIL,MOTRIN) 200 MG tablet Take 400 mg by mouth every 6 (six) hours as needed for moderate pain.   07/04/2014 at Unknown time  . neomycin-bacitracin-polymyxin (NEOSPORIN) [phone] ointment Apply topically 4 (four) times daily. (Patient taking differently: Apply 1 application topically 4 (four) times daily. ) 28.3 g 0 07/03/2014 at Unknown time  . azithromycin (ZITHROMAX) 250 MG tablet Take all 4 tabs po stat (Patient not taking: Reported on 07/04/2014) 4 tablet 0 Unknown at Unknown time  . fluconazole (DIFLUCAN) 150 MG tablet 1 tab po x 1. May repeat in 72 hours if no improvement (Patient not taking: Reported on 07/04/2014) 2 tablet 0   . fluconazole (DIFLUCAN) 150 MG tablet Take 1 tablet (150 mg total) by mouth daily. Repeat dose in 3 days (Patient not taking: Reported on 07/04/2014) 2 tablet 0   . HYDROcodone-acetaminophen (NORCO/VICODIN) 5-325 MG per tablet Take 1 tablet by mouth every 6 (six) hours as needed. (Patient not taking: Reported on 07/04/2014) 12 tablet 0   . metroNIDAZOLE (FLAGYL) 500 MG tablet Take 1 tablet (500 mg total) by mouth 2 (two) times daily. X 7 days (Patient not taking: Reported on 07/04/2014) 14 tablet 0   . metroNIDAZOLE  (FLAGYL) 500 MG tablet Take 1 tablet (500 mg total) by mouth 2 (two) times daily. (Patient not taking: Reported on 07/04/2014) 14 tablet 0     Review of Systems  Constitutional: Negative for fever and chills.  Respiratory: Negative for cough and shortness of breath.   Cardiovascular: Negative for chest pain and leg swelling.  Gastrointestinal: Negative for heartburn, nausea, vomiting and diarrhea.  Genitourinary: Negative for dysuria, urgency, frequency and hematuria.  Neurological:       No headache   Physical Exam   Blood pressure 115/73, pulse 77, temperature 98.6 F (37 C), temperature source Oral, resp. rate 18, weight 145 lb (65.772 kg).  Physical Exam  Constitutional: She is oriented to person, place, and time. She appears well-developed and well-nourished.  HENT:  Head: Normocephalic and atraumatic.  Eyes: Conjunctivae and EOM are normal.  Neck: Normal range of motion.  Cardiovascular: Normal rate.   Respiratory: Effort normal. No respiratory distress.  GI: Soft. Bowel sounds are normal. She exhibits no distension. There is no tenderness.  Genitourinary:  Clitoral edema, right vulvar edema, previous incision healed/intact, no drainage, no fluctuance, no bartholin's cyst  Musculoskeletal: Normal range of motion. She exhibits no edema.  Neurological: She is alert and oriented to person, place, and time.  Skin: Skin is warm and dry. No erythema.    MAU Course  Procedures  MDM  Assessment and Plan  19 yo female here with initially suspected bartholin's cyst with no bartholin's cyst but instead vulvar cellulitis - rx bactrim, has pain medications, warm compresses.  Shilo Pauwels ROCIO 07/04/2014, 5:15 PM

## 2014-07-04 NOTE — MAU Note (Signed)
Right labial swelling.  [facility], was told it was a cyst, they cut it but were unable to put the catheter in.  First couple days it drained, but now she thinks it is closed back up because the swelling is worse.

## 2014-07-13 ENCOUNTER — Encounter: Payer: BLUE CROSS/BLUE SHIELD | Admitting: Obstetrics and Gynecology

## 2015-08-03 ENCOUNTER — Emergency Department (HOSPITAL_COMMUNITY): Payer: BLUE CROSS/BLUE SHIELD

## 2015-08-03 ENCOUNTER — Encounter (HOSPITAL_COMMUNITY): Payer: Self-pay | Admitting: Emergency Medicine

## 2015-08-03 ENCOUNTER — Emergency Department (HOSPITAL_COMMUNITY)
Admission: EM | Admit: 2015-08-03 | Discharge: 2015-08-03 | Disposition: A | Discharge status: Home / Self Care | Payer: BLUE CROSS/BLUE SHIELD | Attending: Emergency Medicine | Admitting: Emergency Medicine

## 2015-08-03 DIAGNOSIS — S0181XA Laceration without foreign body of other part of head, initial encounter: Secondary | ICD-10-CM | POA: Insufficient documentation

## 2015-08-03 DIAGNOSIS — Z792 Long term (current) use of antibiotics: Secondary | ICD-10-CM | POA: Insufficient documentation

## 2015-08-03 DIAGNOSIS — Z791 Long term (current) use of non-steroidal anti-inflammatories (NSAID): Secondary | ICD-10-CM | POA: Diagnosis not present

## 2015-08-03 DIAGNOSIS — Y999 Unspecified external cause status: Secondary | ICD-10-CM | POA: Diagnosis not present

## 2015-08-03 DIAGNOSIS — Z79899 Other long term (current) drug therapy: Secondary | ICD-10-CM | POA: Insufficient documentation

## 2015-08-03 DIAGNOSIS — Y929 Unspecified place or not applicable: Secondary | ICD-10-CM | POA: Diagnosis not present

## 2015-08-03 DIAGNOSIS — Y939 Activity, unspecified: Secondary | ICD-10-CM | POA: Insufficient documentation

## 2015-08-03 DIAGNOSIS — Z79891 Long term (current) use of opiate analgesic: Secondary | ICD-10-CM | POA: Insufficient documentation

## 2015-08-03 DIAGNOSIS — R51 Headache: Secondary | ICD-10-CM | POA: Insufficient documentation

## 2015-08-03 DIAGNOSIS — W01198A Fall on same level from slipping, tripping and stumbling with subsequent striking against other object, initial encounter: Secondary | ICD-10-CM | POA: Diagnosis not present

## 2015-08-03 DIAGNOSIS — S0191XA Laceration without foreign body of unspecified part of head, initial encounter: Secondary | ICD-10-CM

## 2015-08-03 LAB — POC URINE PREG, ED: PREG TEST UR: NEGATIVE

## 2015-08-03 NOTE — ED Provider Notes (Signed)
CSN: [id number]     Arrival date & time 08/03/15  1602 History  By signing my name below, I, Marisue HumbleMichelle Chaffee, attest that this documentation has been prepared under the direction and in the presence of non-physician practitioner, Audry Piliyler Gurleen Larrivee, PA. Electronically Signed: Marisue HumbleMichelle Chaffee, Scribe. 08/03/2015. 5:38 PM.    Chief Complaint  Patient presents with  . Facial Laceration    1cm laceration to forehead   The history is provided by the patient and a friend. No language interpreter was used.   HPI Comments:  Julie Cobb is a 20 y.o. female who presents to the Emergency Department complaining of 1cm laceration to her forehead s/p fall from standing ~1.5 hours ago. Pt states she tripped and fell, hitting her head on the bottom bar of a bar stool. Pt states she doesn't remember the moments directly after the injury. Friend states pt was staring into space, looked "stunned" but was responsive. Pt reports associated dizziness, nausea, and 6/10 throbbing headache. Pt also expresses concern for pregnancy. Last menstrual cycle in February. Denies vomiting. No CP/SOB. No other symptoms noted.   Past Medical History  Diagnosis Date  . Bartholin cyst    Past Surgical History  Procedure Laterality Date  . Breast biopsy     History reviewed. No pertinent family history. Social History  Substance Use Topics  . Smoking status: Never Smoker   . Smokeless tobacco: None  . Alcohol Use: No   OB History    Gravida Para Term Preterm AB TAB SAB Ectopic Multiple Living   0 0 0 0 0 0 0 0 0 0      Review of Systems  Gastrointestinal: Positive for nausea. Negative for vomiting.  Skin: Positive for wound.  Neurological: Positive for dizziness and headaches.   Allergies  Review of patient's allergies indicates no known allergies.  Home Medications   Prior to Admission medications   Medication Sig Start Date End Date Taking? Authorizing Provider  HYDROcodone-acetaminophen (NORCO/VICODIN) 5-325 MG  per tablet Take 1 tablet by mouth every 6 (six) hours as needed. Patient not taking: Reported on 07/04/2014 06/29/14   Derwood KaplanAnkit Nanavati, MD  ibuprofen (ADVIL,MOTRIN) 200 MG tablet Take 400 mg by mouth every 6 (six) hours as needed for moderate pain.    Historical Provider, MD  neomycin-bacitracin-polymyxin (NEOSPORIN) [phone] ointment Apply topically 4 (four) times daily. Patient taking differently: Apply 1 application topically 4 (four) times daily.  06/29/14   Derwood KaplanAnkit Nanavati, MD  sulfamethoxazole-trimethoprim (BACTRIM) 400-80 MG per tablet Take 1 tablet by mouth 2 (two) times daily. 07/04/14   Fredirick LatheKristy Acosta, MD   BP 142/80 mmHg  Pulse 78  Temp(Src) 98.2 F (36.8 C) (Oral)  Resp 18  Ht 5\' 3"  (1.6 m)  Wt 145 lb (65.772 kg)  BMI 25.69 kg/m2  SpO2 100%  LMP 05/19/2015 (Approximate)   Physical Exam  Constitutional: She is oriented to person, place, and time. She appears well-developed and well-nourished.  HENT:  Head: Normocephalic and atraumatic.  1cm superficial vertical laceration noted on forehead. Bleeding controlled. Hematoma noted. No erythema.   Eyes: Conjunctivae and EOM are normal. Pupils are equal, round, and reactive to light. Right eye exhibits no discharge. Left eye exhibits no discharge.  Neck: Trachea normal, normal range of motion and full passive range of motion without pain. Neck supple. No spinous process tenderness and no muscular tenderness present. No tracheal deviation, no erythema and normal range of motion present.  Cardiovascular: Normal rate and regular rhythm.   Pulmonary/Chest: Effort normal.  No respiratory distress.  Abdominal: Soft.  Musculoskeletal: Normal range of motion.  Neurological: She is alert and oriented to person, place, and time. She has normal strength. No cranial nerve deficit or sensory deficit. Coordination normal.  Cranial Nerves:  II: Pupils equal, round, reactive to light III,IV, VI: ptosis not present, extra-ocular motions intact bilaterally   V,VII: smile symmetric, facial light touch sensation equal VIII: hearing grossly normal bilaterally  IX,X: midline uvula rise  XI: bilateral shoulder shrug equal and strong XII: midline tongue extension  Skin: Skin is warm and dry. No rash noted. She is not diaphoretic. No erythema.  Psychiatric: She has a normal mood and affect. Her behavior is normal. Thought content normal.  Nursing note and vitals reviewed.  ED Course  Procedures  DIAGNOSTIC STUDIES:  Oxygen Saturation is 100% on RA, normal by my interpretation.    COORDINATION OF CARE:  4:38 PM Will Dermabond laceration and order head CT. Discussed treatment plan with pt at bedside and pt agreed to plan.  Labs Review Labs Reviewed  POC URINE PREG, ED    Imaging Review Ct Head Wo Contrast  08/03/2015  CLINICAL DATA:  Patient status post fall. Head trauma. Positive reported loss of consciousness. EXAM: CT HEAD WITHOUT CONTRAST TECHNIQUE: Contiguous axial images were obtained from the base of the skull through the vertex without intravenous contrast. COMPARISON:  None. FINDINGS: Ventricles and sulci are appropriate for patient's age. No evidence for acute cortically based infarct, intracranial hemorrhage, mass lesion or mass-effect. Soft tissue swelling overlying the right forehead. Orbits are unremarkable. Paranasal sinuses are well aerated. Mastoid air cells are unremarkable. Calvarium is intact. IMPRESSION: No acute intracranial process. Electronically Signed   By: Annia Belt M.D.   On: 08/03/2015 17:25   I have personally reviewed and evaluated these images and lab results as part of my medical decision-making.   EKG Interpretation None      MDM  I have reviewed and evaluated the relevant laboratory values I have reviewed and evaluated the relevant imaging studies.  I have reviewed the relevant previous healthcare records. I obtained HPI from historian.  ED Course:  Assessment: Pt is a 19yF who presents with head lac  s/p mechanical fall from standing <1 hour ago. On exam, pt in NAD. Nontoxic/nonseptic appearing. VSS. Afebrile. <1cm superficial noted on forehead. Hematoma noted. No erythema. Due to mechanism and symptoms, CT Head ordered, which was unremarkable. Urine unremarkable. Pt requested urine pregnancy since LMP in Feb and patient having Nausea. No ABD pain. No vaginal bleeding/discharge. Plan is to DC Home with follow up to PCP. At time of discharge, Patient is in no acute distress. Vital Signs are stable. Patient is able to ambulate. Patient able to tolerate PO.    Disposition/Plan:  DC Home Additional Verbal discharge instructions given and discussed with patient.  Pt Instructed to f/u with PCP in the next week for evaluation and treatment of symptoms. Return precautions given Pt acknowledges and agrees with plan  Supervising Physician Lorre Nick, MD   Final diagnoses:  Laceration of head, initial encounter    I personally performed the services described in this documentation, which was scribed in my presence. The recorded information has been reviewed and is accurate.    Audry Pili, PA-C 08/03/15 1812  Lorre Nick, MD 08/11/15 [phone]

## 2015-08-03 NOTE — ED Notes (Signed)
1cm laceration to forehead, bleeding controlled. Shallow laceration noted, well approximated. Pt reported feeling dizzy after incident. C/o nausea at present. Pt stated that she tripped and fell into edge of bar stool. Stated that she "blacked out", friend stated that she looked "stunned"

## 2015-08-03 NOTE — Discharge Instructions (Signed)
Please read and follow all provided instructions.  Your diagnoses today include:  1. Laceration of head, initial encounter    Tests performed today include:  Vital signs. See below for your results today.   Medications prescribed:   None   Home care instructions:  Follow any educational materials contained in this packet.  Follow-up instructions: Please follow-up with your primary care provider in the next 48 hours for further evaluation of symptoms and treatment   Return instructions:   Please return to the Emergency Department if you do not get better, if you get worse, or new symptoms OR  - Fever (temperature greater than 101.36F)  - Bleeding that does not stop with holding pressure to the area    -Severe pain (please note that you may be more sore the day after your accident)  - Chest Pain  - Difficulty breathing  - Severe nausea or vomiting  - Inability to tolerate food and liquids  - Passing out  - Skin becoming red around your wounds  - Change in mental status (confusion or lethargy)  - New numbness or weakness     Please return if you have any other emergent concerns.  Additional Information:  Your vital signs today were: BP 142/80 mmHg   Pulse 78   Temp(Src) 98.2 F (36.8 C) (Oral)   Resp 18   Ht 5\' 3"  (1.6 m)   Wt 65.772 kg   BMI 25.69 kg/m2   SpO2 100%   LMP 05/19/2015 (Approximate) If your blood pressure (BP) was elevated above 135/85 this visit, please have this repeated by your doctor within one month. ---------------

## 2018-02-21 IMAGING — CT CT HEAD W/O CM
2 series · 15 of 30 positions shown, 17 images · non-contrast
Comparison: None.

CLINICAL DATA: Patient status post fall. Head trauma. Positive
reported loss of consciousness.

EXAM:
CT HEAD WITHOUT CONTRAST
TECHNIQUE: Contiguous axial images were obtained from the base of the skull
through the vertex without intravenous contrast.

[Series 2: head w/o · axial · non-contrast · 0.45mm/px · z∈[-148,-38]mm · 7 of 30 slices shown, 9 images]
[im 4/30  brain]
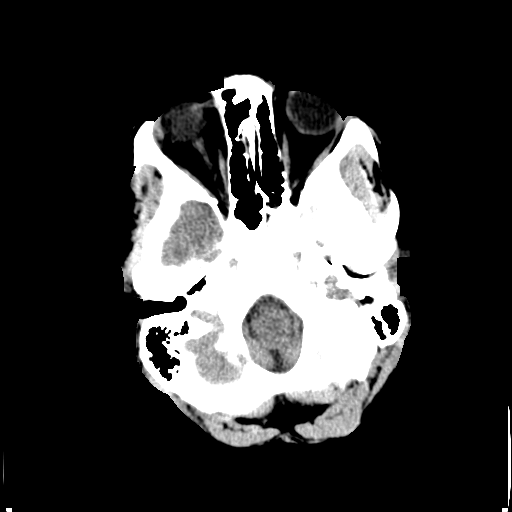
[im 4/30  bone]
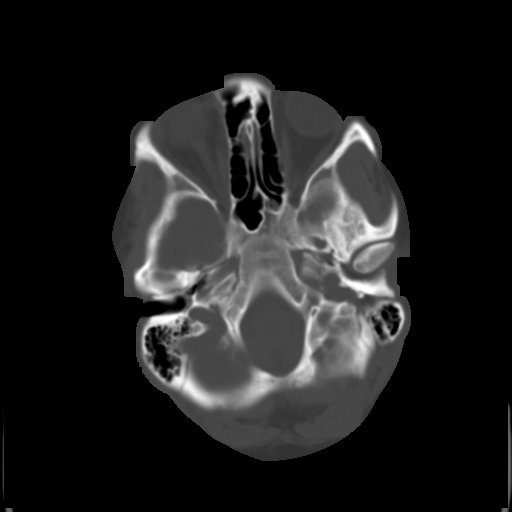
[im 8/30  brain]
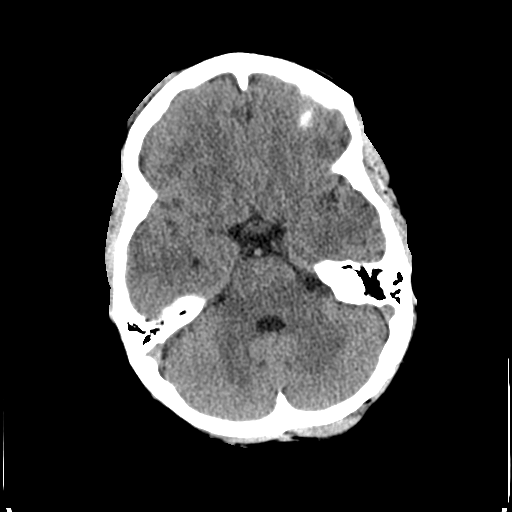
[im 11/30  brain]
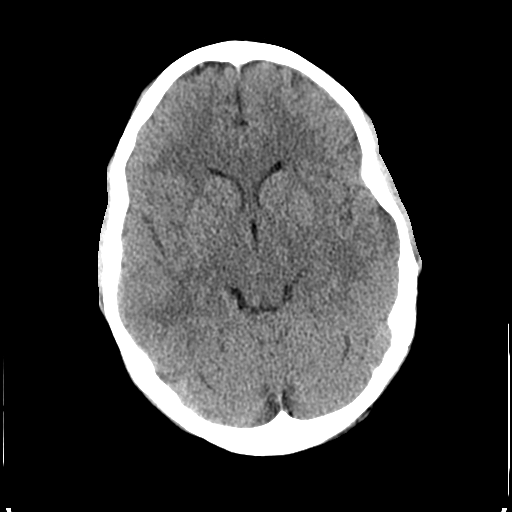
[im 15/30  brain]
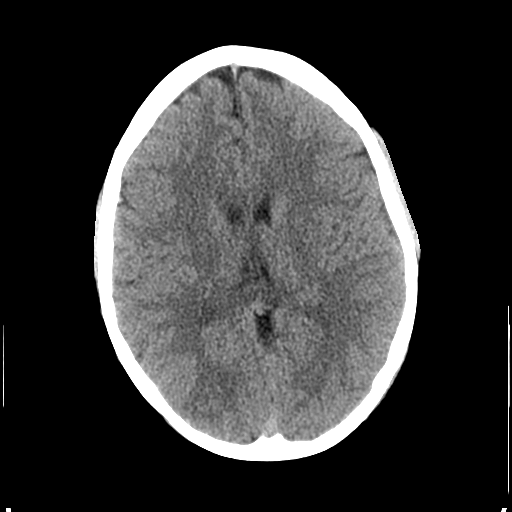
[im 19/30  brain]
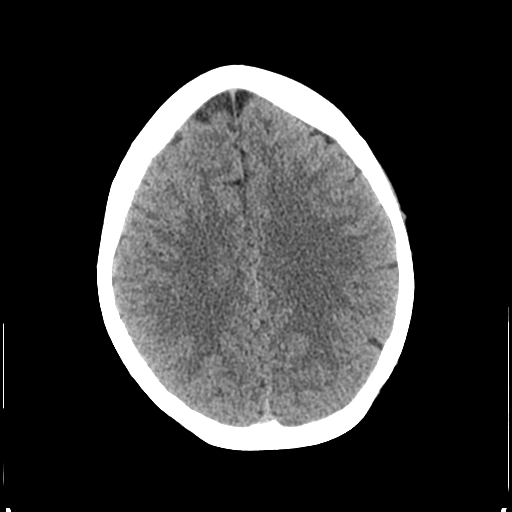
[im 19/30  bone]
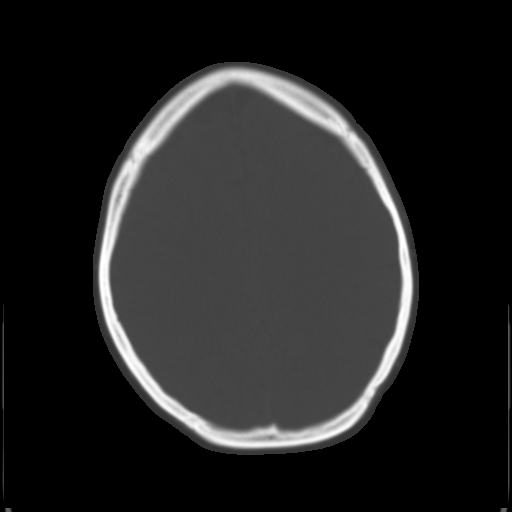
[im 22/30  brain]
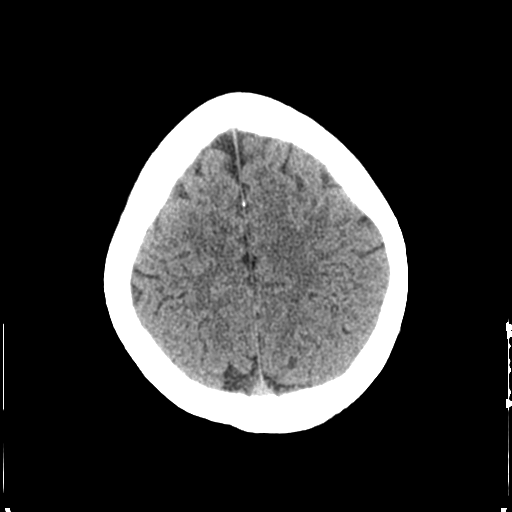
[im 26/30  brain]
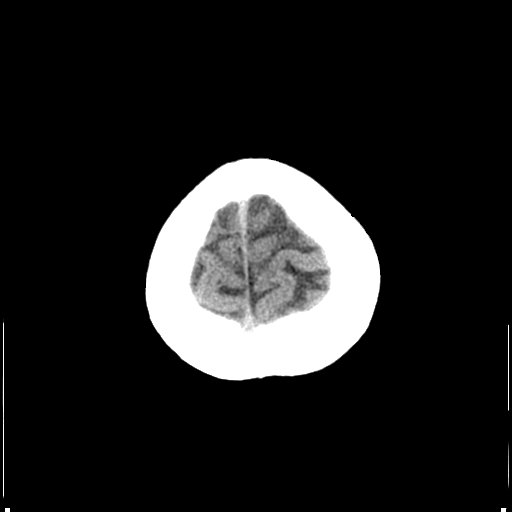

[Series 3: bone windows · axial · 0.45mm/px · z∈[-149,-31]mm · 8 of 75 slices shown]
[im 8/75  bone]
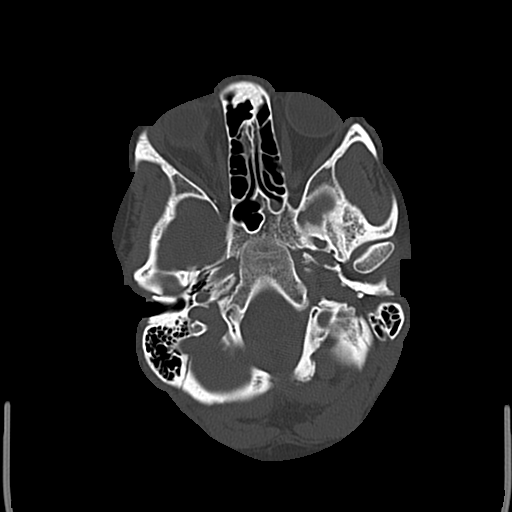
[im 15/75  bone]
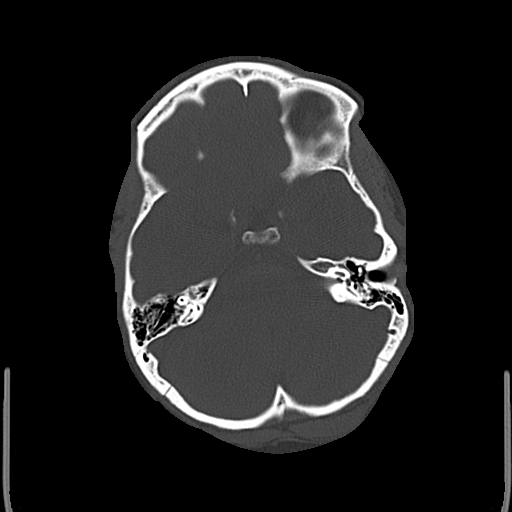
[im 23/75  bone]
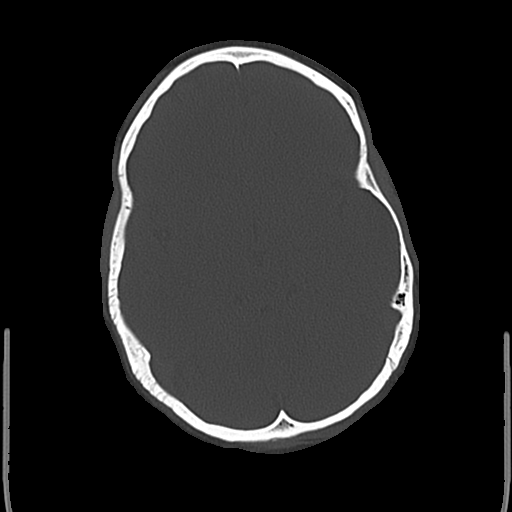
[im 34/75  bone]
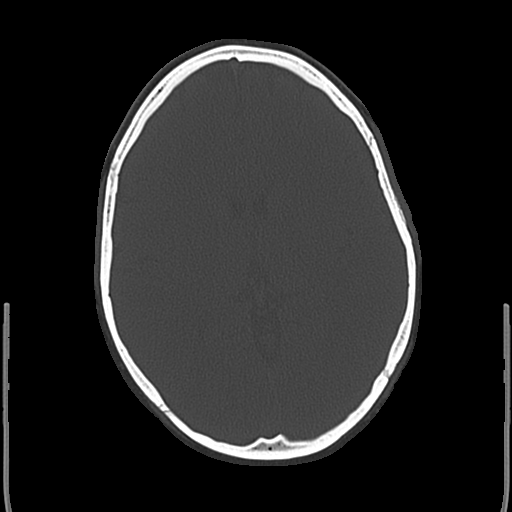
[im 41/75  bone]
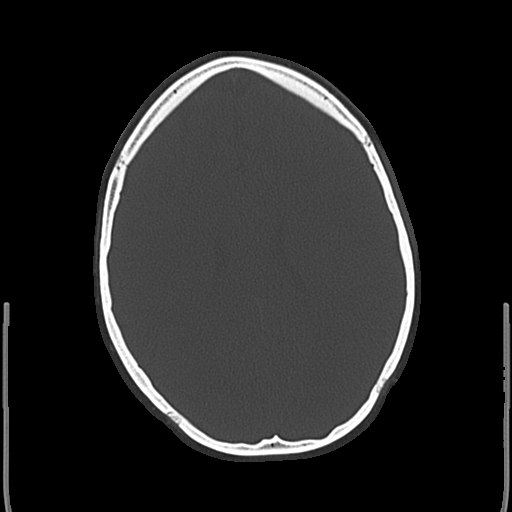
[im 52/75  bone]
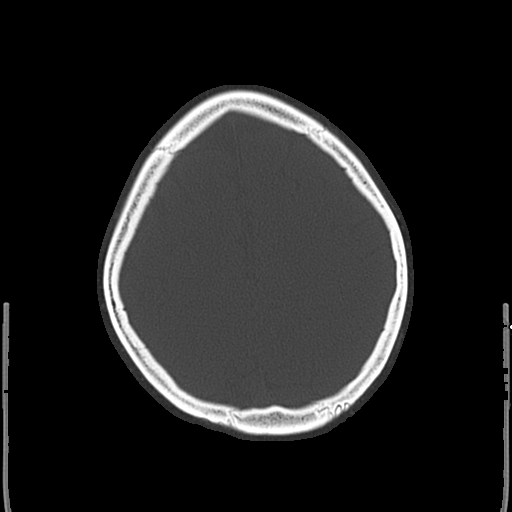
[im 60/75  bone]
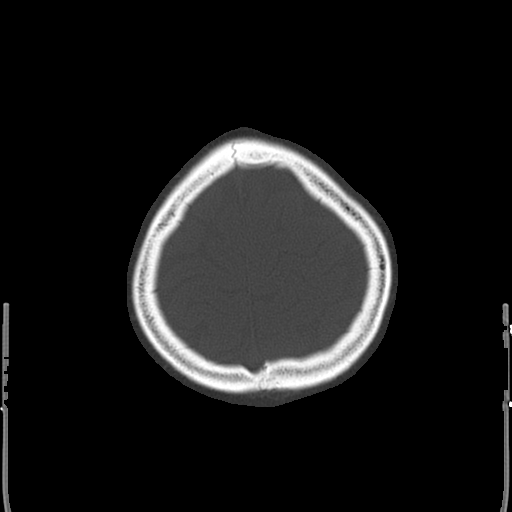
[im 67/75  bone]
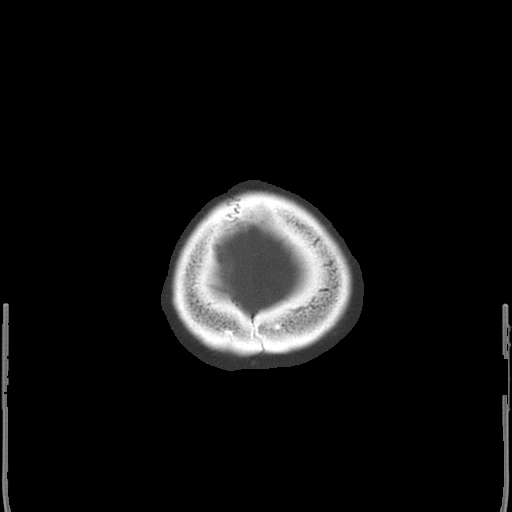

[15 of 30 positions shown; findings below may reference images not displayed]

FINDINGS: Ventricles and sulci are appropriate for patient's age. No evidence
for acute cortically based infarct, intracranial hemorrhage, mass
lesion or mass-effect. Soft tissue swelling overlying the right
forehead. Orbits are unremarkable. Paranasal sinuses are well
aerated. Mastoid air cells are unremarkable. Calvarium is intact.
IMPRESSION: No acute intracranial process.
